# Patient Record
Sex: Male | Born: 2000 | Race: Black or African American | Hispanic: No | Marital: Single | Smoking: Current some day smoker
Health system: Southern US, Community
[De-identification: ages and names within clinical notes are randomized; demographics above are authoritative.]

## PROBLEM LIST (undated history)

## (undated) DIAGNOSIS — T7840XA Allergy, unspecified, initial encounter: Secondary | ICD-10-CM

## (undated) DIAGNOSIS — J4530 Mild persistent asthma, uncomplicated: Secondary | ICD-10-CM

## (undated) HISTORY — DX: Mild persistent asthma, uncomplicated: J45.30

## (undated) HISTORY — DX: Allergy, unspecified, initial encounter: T78.40XA

## (undated) HISTORY — PX: UMBILICAL HERNIA REPAIR: SUR1181

---
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
## 2019-05-12 ENCOUNTER — Encounter (HOSPITAL_COMMUNITY): Payer: Self-pay | Admitting: *Deleted

## 2019-05-12 ENCOUNTER — Emergency Department (HOSPITAL_COMMUNITY): Payer: Medicaid Other

## 2019-05-12 ENCOUNTER — Emergency Department (HOSPITAL_COMMUNITY)
Admission: EM | Admit: 2019-05-12 | Discharge: 2019-05-12 | Disposition: A | Discharge status: Home / Self Care | Payer: Medicaid Other | Attending: Emergency Medicine | Admitting: Emergency Medicine

## 2019-05-12 ENCOUNTER — Other Ambulatory Visit: Payer: Self-pay

## 2019-05-12 DIAGNOSIS — Y9301 Activity, walking, marching and hiking: Secondary | ICD-10-CM | POA: Insufficient documentation

## 2019-05-12 DIAGNOSIS — Y999 Unspecified external cause status: Secondary | ICD-10-CM | POA: Insufficient documentation

## 2019-05-12 DIAGNOSIS — J45909 Unspecified asthma, uncomplicated: Secondary | ICD-10-CM | POA: Diagnosis not present

## 2019-05-12 DIAGNOSIS — S93402A Sprain of unspecified ligament of left ankle, initial encounter: Secondary | ICD-10-CM | POA: Insufficient documentation

## 2019-05-12 DIAGNOSIS — Y9289 Other specified places as the place of occurrence of the external cause: Secondary | ICD-10-CM | POA: Insufficient documentation

## 2019-05-12 DIAGNOSIS — S99912A Unspecified injury of left ankle, initial encounter: Secondary | ICD-10-CM | POA: Diagnosis present

## 2019-05-12 DIAGNOSIS — X509XXA Other and unspecified overexertion or strenuous movements or postures, initial encounter: Secondary | ICD-10-CM | POA: Insufficient documentation

## 2019-05-12 MED ORDER — IBUPROFEN 400 MG PO TABS
400.0000 mg | ORAL_TABLET | Freq: Once | ORAL | Status: AC | PRN
  Administered 2019-05-12: 400 mg via ORAL
  Filled 2019-05-12: qty 1

## 2019-05-12 NOTE — Discharge Instructions (Addendum)
Wear Ace bandage for comfort and compression.  Ice for 20 minutes every 2 hours while awake for the next 2 days.  Keep your leg elevated as much as possible.  Follow-up with your primary doctor if not improving in the next 1 to 2 weeks.

## 2019-05-12 NOTE — ED Triage Notes (Signed)
Pt was walking to work, stumbled twice, injurying L ankle; unable to bear weight, mild swelling noted

## 2019-05-12 NOTE — ED Provider Notes (Signed)
[facility] Provider Note   CSN: [id number] Arrival date & time: 05/12/19  0006     History   Chief Complaint Chief Complaint  Patient presents with  . Foot Pain    HPI Donald Branch is a 18 y.o. male.     Patient is an 18 year old male presenting with complaints of a left ankle injury.  Patient states he was walking to work when he turned his ankle on 2 occasions.  He reports swelling and difficulty bearing weight.  The history is provided by the patient.  Foot Pain This is a new problem. Episode onset: Earlier this evening. The problem occurs constantly. The problem has not changed since onset.The symptoms are aggravated by walking. Nothing relieves the symptoms. He has tried nothing for the symptoms.    Past Medical History:  Diagnosis Date  . Allergy   . Asthma   . Asthma, mild persistent 12/13/2011    Patient Active Problem List   Diagnosis Date Noted  . Viral gastroenteritis 02/07/2014  . Right ankle pain 12/08/2012  . Asthma, mild persistent 12/13/2011    Past Surgical History:  Procedure Laterality Date  . UMBILICAL HERNIA REPAIR          Home Medications    Prior to Admission medications   Medication Sig Start Date End Date Taking? Authorizing Provider  albuterol (PROVENTIL,VENTOLIN) 90 MCG/ACT inhaler Inhale 2 puffs into the lungs every 6 (six) hours as needed for wheezing. 12/13/11 12/07/12  Durwin RegesKonkol, Jill N, MD  ibuprofen (ADVIL,MOTRIN) 200 MG tablet Take 200 mg by mouth every 6 (six) hours as needed for mild pain or moderate pain.    [provider]    Family History Family History  Problem Relation Age of Onset  . Thyroid disease Mother     Social History Social History   Tobacco Use  . Smoking status: Never Smoker  . Smokeless tobacco: Never Used  Substance Use Topics  . Alcohol use: No  . Drug use: No     Allergies   Patient has no known allergies.   Review of Systems Review of  Systems  All other systems reviewed and are negative.    Physical Exam Updated Vital Signs BP (!) 120/94 (BP Location: Right Arm)   Pulse 74   Temp 98.1 F (36.7 C) (Oral)   Resp 20   SpO2 100%   Physical Exam Vitals signs and nursing note reviewed.  Constitutional:      General: He is not in acute distress.    Appearance: Normal appearance. He is not ill-appearing.  HENT:     Head: Normocephalic and atraumatic.  Pulmonary:     Effort: Pulmonary effort is normal.  Musculoskeletal:     Comments: The left ankle appears grossly normal.  There is minimal swelling noted.  He is tender to the medial malleolus, however there is no deformity.  DP pulses are palpable.  Sensation is intact to the entire foot.  Skin:    General: Skin is warm and dry.  Neurological:     General: No focal deficit present.     Mental Status: He is alert.      ED Treatments / Results  Labs (all labs ordered are listed, but only abnormal results are displayed) Labs Reviewed - No data to display  EKG    Radiology Dg Ankle Complete Left  Result Date: 05/12/2019 CLINICAL DATA:  Left ankle injury, pain EXAM: LEFT ANKLE COMPLETE - 3+ VIEW COMPARISON:  None. FINDINGS: There is no evidence of fracture, dislocation, or joint effusion. There is no evidence of arthropathy or other focal bone abnormality. Soft tissues are unremarkable. IMPRESSION: Negative. Electronically Signed   By: Rolm Baptise M.D.   On: 05/12/2019 00:45    Procedures Procedures (including critical care time)  Medications Ordered in ED Medications  ibuprofen (ADVIL) tablet 400 mg (400 mg Oral Given 05/12/19 0023)     Initial Impression / Assessment and Plan / ED Course  I have reviewed the triage vital signs and the nursing notes.  Pertinent labs & imaging results that were available during my care of the patient were reviewed by me and considered in my medical decision making (see chart for details).  X-rays negative.  This  will be treated as a sprain.  Patient will be placed in an Ace bandage, advised to ice, rest, elevate, and follow-up as needed.  Final Clinical Impressions(s) / ED Diagnoses   Final diagnoses:  None    ED Discharge Orders    None       Veryl Speak, MD 05/12/19 [phone]

## 2019-09-24 IMAGING — CR LEFT ANKLE COMPLETE - 3+ VIEW
3 series · 3 of 3 positions shown · non-contrast
Comparison: None.

CLINICAL DATA: Left ankle injury, pain

EXAM:
LEFT ANKLE COMPLETE - 3+ VIEW

[ankle ap]
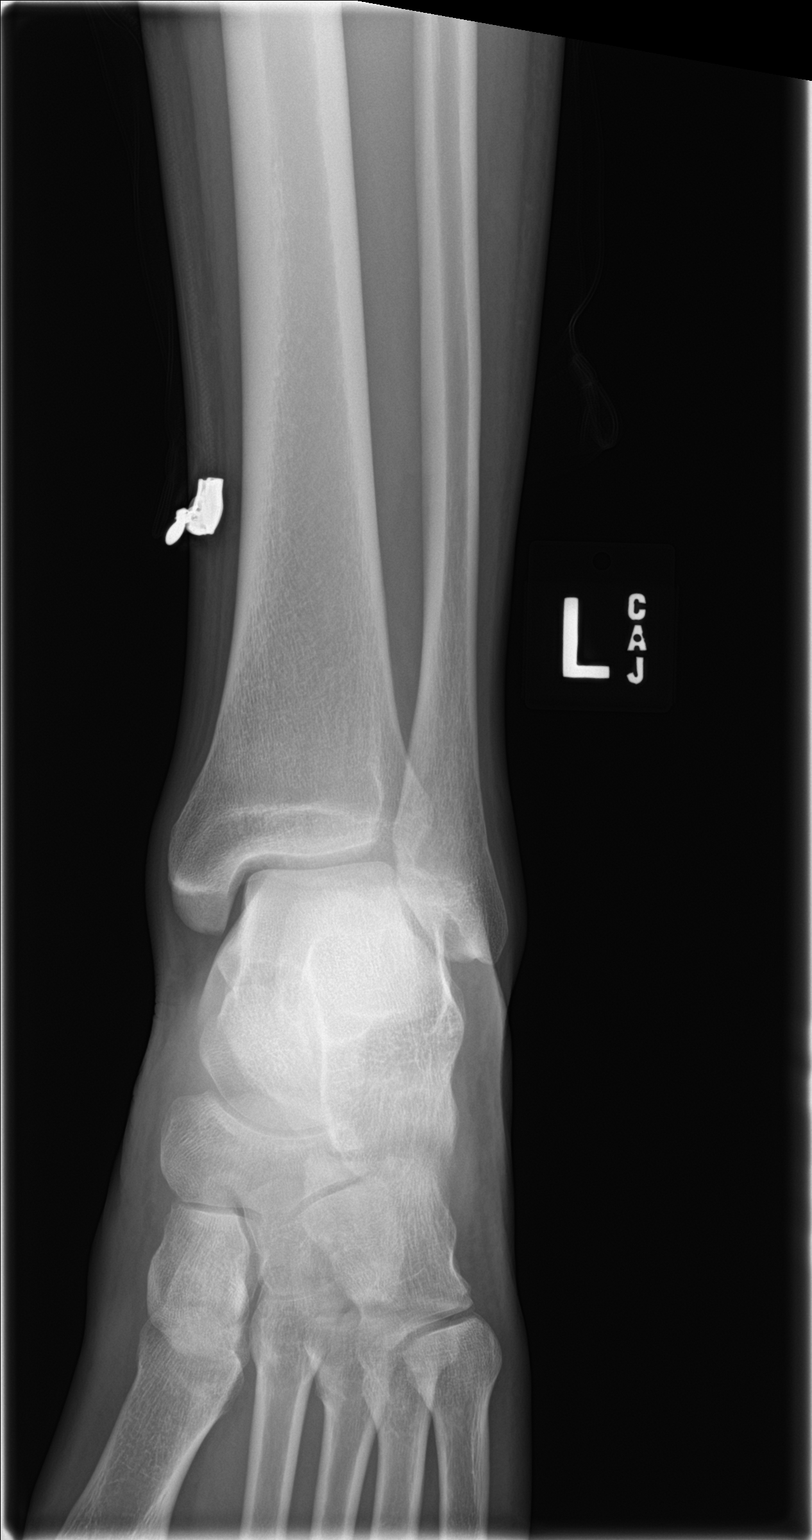

[ankle obl]
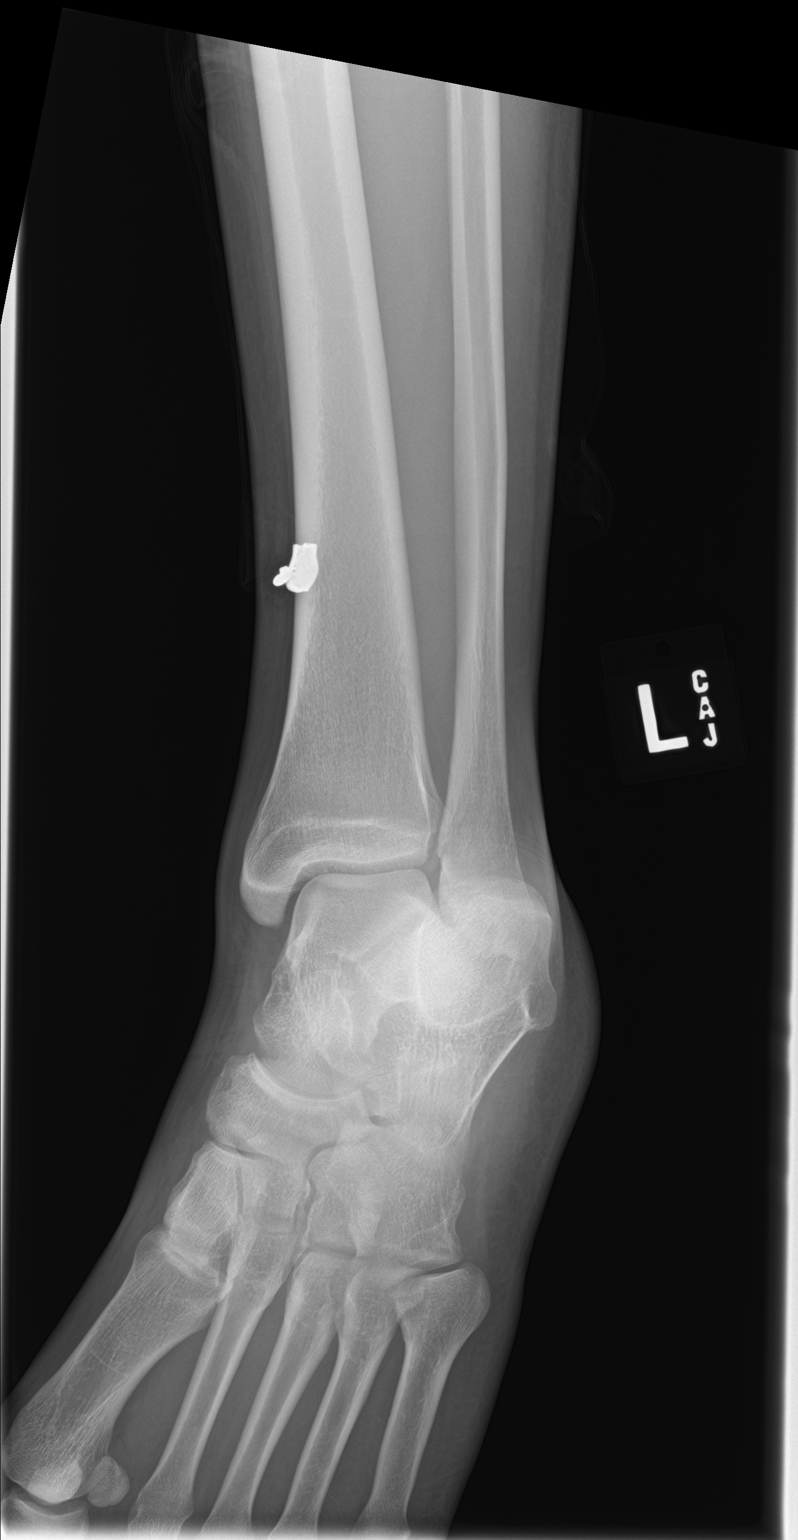

[ankle lat]
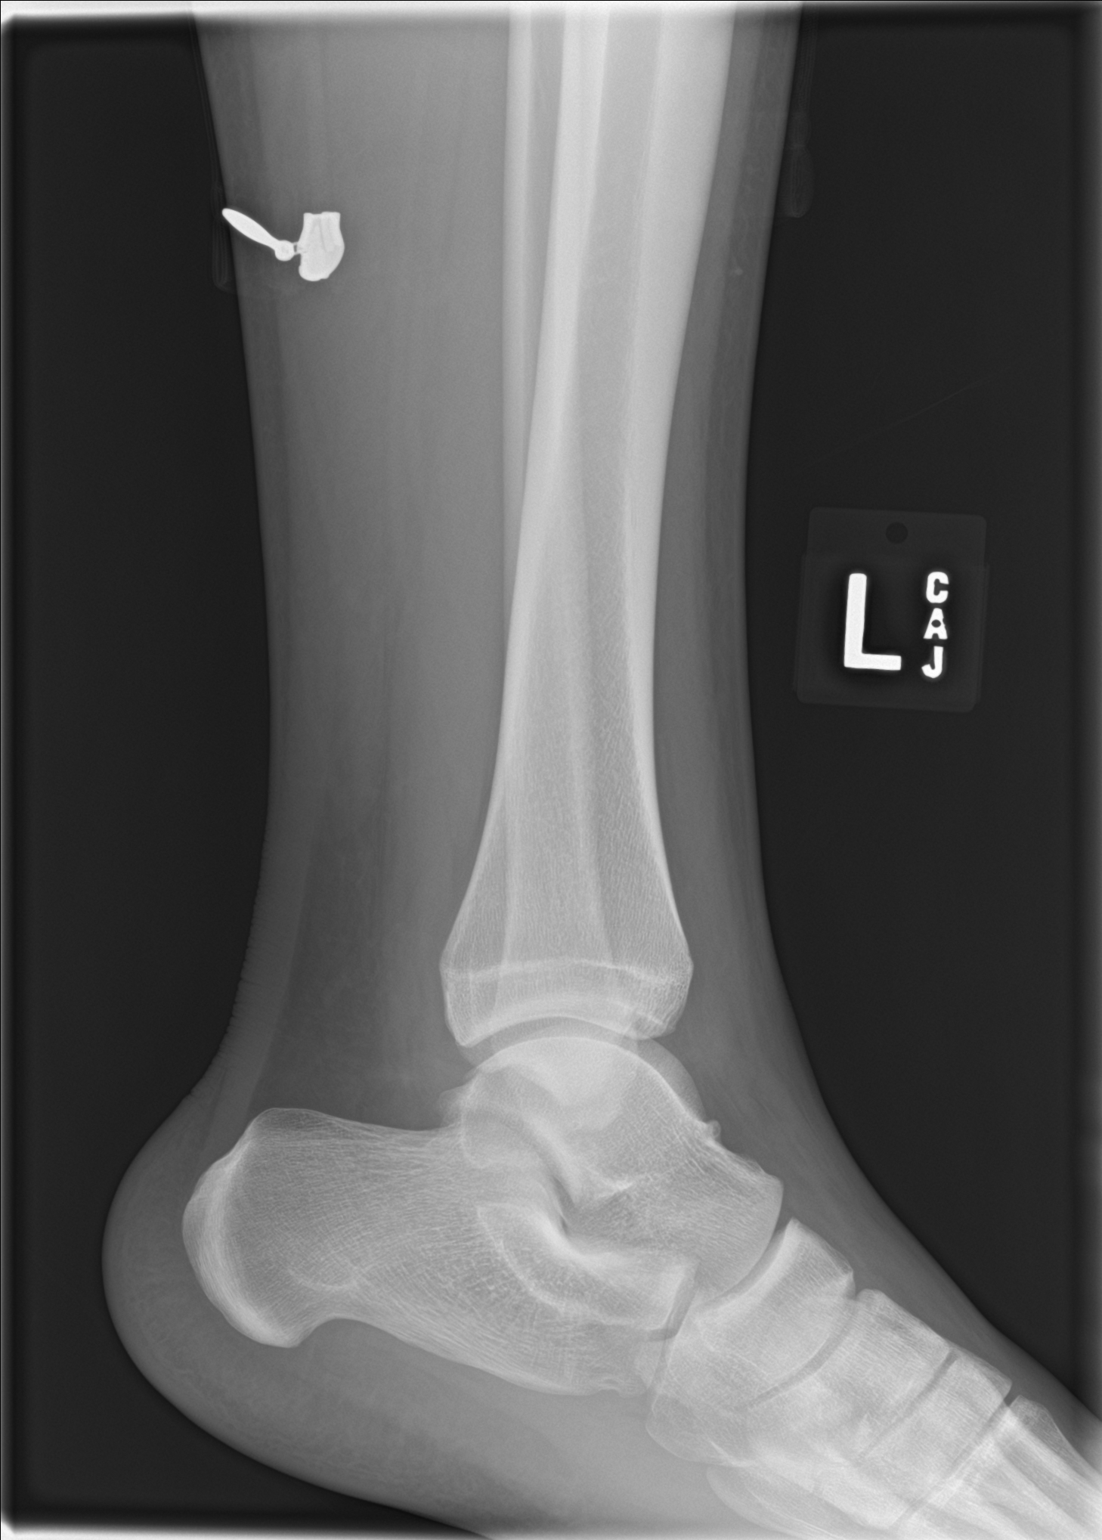

[3 of 3 positions shown; findings below may reference images not displayed]

FINDINGS: There is no evidence of fracture, dislocation, or joint effusion.
There is no evidence of arthropathy or other focal bone abnormality.
Soft tissues are unremarkable.
IMPRESSION: Negative.

## 2020-05-29 DIAGNOSIS — Z419 Encounter for procedure for purposes other than remedying health state, unspecified: Secondary | ICD-10-CM | POA: Diagnosis not present

## 2020-06-29 DIAGNOSIS — Z419 Encounter for procedure for purposes other than remedying health state, unspecified: Secondary | ICD-10-CM | POA: Diagnosis not present

## 2020-07-09 ENCOUNTER — Emergency Department (HOSPITAL_COMMUNITY)
Admission: EM | Admit: 2020-07-09 | Discharge: 2020-07-09 | Disposition: A | Discharge status: Home / Self Care | Payer: Medicaid Other | Attending: Emergency Medicine | Admitting: Emergency Medicine

## 2020-07-09 ENCOUNTER — Other Ambulatory Visit: Payer: Self-pay

## 2020-07-09 ENCOUNTER — Encounter (HOSPITAL_COMMUNITY): Payer: Self-pay | Admitting: Emergency Medicine

## 2020-07-09 DIAGNOSIS — J4530 Mild persistent asthma, uncomplicated: Secondary | ICD-10-CM | POA: Diagnosis not present

## 2020-07-09 DIAGNOSIS — Z1152 Encounter for screening for COVID-19: Secondary | ICD-10-CM | POA: Diagnosis not present

## 2020-07-09 DIAGNOSIS — U071 COVID-19: Secondary | ICD-10-CM | POA: Diagnosis not present

## 2020-07-09 DIAGNOSIS — Z20822 Contact with and (suspected) exposure to covid-19: Secondary | ICD-10-CM

## 2020-07-09 DIAGNOSIS — R430 Anosmia: Secondary | ICD-10-CM | POA: Diagnosis present

## 2020-07-09 LAB — RESPIRATORY PANEL BY RT PCR (FLU A&B, COVID)
Influenza A by PCR: NEGATIVE
Influenza B by PCR: NEGATIVE
SARS Coronavirus 2 by RT PCR: POSITIVE — AB

## 2020-07-09 NOTE — ED Triage Notes (Signed)
Patient states that he has had a covid exposure to family member.  Patient states that he has lost the sense of smell.  No shortness of breath, no fevers.

## 2020-07-09 NOTE — Discharge Instructions (Signed)
Your Covid results will take several hours to result.  You may check this via MyChart.  If this is positive, please be sure to quarantine for an additional 7 to 10 days.  You may take over-the-counter Tylenol/ ibuprofen, cold and flu medications, drink plenty of fluids.  You may buy over-the-counter pulse oximeter to measure your oxygen levels.  If this drops below 90%, please return to the ER.  Return to the ER for any worsening symptoms.     Person Under Monitoring Name: Donald Branch  Location: [address] [place] [address]   Infection Prevention Recommendations for Individuals Confirmed to have, or Being Evaluated for, 2019 Novel Coronavirus (COVID-19) Infection Who Receive Care at Home  Individuals who are confirmed to have, or are being evaluated for, COVID-19 should follow the prevention steps below until a healthcare provider or local or state health department says they can return to normal activities.  Stay home except to get medical care You should restrict activities outside your home, except for getting medical care. Do not go to work, school, or public areas, and do not use public transportation or taxis.  Call ahead before visiting your doctor Before your medical appointment, call the healthcare provider and tell them that you have, or are being evaluated for, COVID-19 infection. This will help the healthcare provider's office take steps to keep other people from getting infected. Ask your healthcare provider to call the local or state health department.  Monitor your symptoms Seek prompt medical attention if your illness is worsening (e.g., difficulty breathing). Before going to your medical appointment, call the healthcare provider and tell them that you have, or are being evaluated for, COVID-19 infection. Ask your healthcare provider to call the local or state health department.  Wear a facemask You should wear a facemask that covers your nose and mouth  when you are in the same room with other people and when you visit a healthcare provider. People who live with or visit you should also wear a facemask while they are in the same room with you.  Separate yourself from other people in your home As much as possible, you should stay in a different room from other people in your home. Also, you should use a separate bathroom, if available.  Avoid sharing household items You should not share dishes, drinking glasses, cups, eating utensils, towels, bedding, or other items with other people in your home. After using these items, you should wash them thoroughly with soap and water.  Cover your coughs and sneezes Cover your mouth and nose with a tissue when you cough or sneeze, or you can cough or sneeze into your sleeve. Throw used tissues in a lined trash can, and immediately wash your hands with soap and water for at least 20 seconds or use an alcohol-based hand rub.  Wash your [employer] your hands often and thoroughly with soap and water for at least 20 seconds. You can use an alcohol-based hand sanitizer if soap and water are not available and if your hands are not visibly dirty. Avoid touching your eyes, nose, and mouth with unwashed hands.   Prevention Steps for Caregivers and Household Members of Individuals Confirmed to have, or Being Evaluated for, COVID-19 Infection Being Cared for in the Home  If you live with, or provide care at home for, a person confirmed to have, or being evaluated for, COVID-19 infection please follow these guidelines to prevent infection:  Follow healthcare provider's instructions Make sure  that you understand and can help the patient follow any healthcare provider instructions for all care.  Provide for the patient's basic needs You should help the patient with basic needs in the home and provide support for getting groceries, prescriptions, and other personal needs.  Monitor the patient's symptoms If  they are getting sicker, call his or her medical provider and tell them that the patient has, or is being evaluated for, COVID-19 infection. This will help the healthcare provider's office take steps to keep other people from getting infected. Ask the healthcare provider to call the local or state health department.  Limit the number of people who have contact with the patient If possible, have only one caregiver for the patient. Other household members should stay in another home or place of residence. If this is not possible, they should stay in another room, or be separated from the patient as much as possible. Use a separate bathroom, if available. Restrict visitors who do not have an essential need to be in the home.  Keep older adults, very young children, and other sick people away from the patient Keep older adults, very young children, and those who have compromised immune systems or chronic health conditions away from the patient. This includes people with chronic heart, lung, or kidney conditions, diabetes, and cancer.  Ensure good ventilation Make sure that shared spaces in the home have good air flow, such as from an air conditioner or an opened window, weather permitting.  Wash your hands often Wash your hands often and thoroughly with soap and water for at least 20 seconds. You can use an alcohol based hand sanitizer if soap and water are not available and if your hands are not visibly dirty. Avoid touching your eyes, nose, and mouth with unwashed hands. Use disposable paper towels to dry your hands. If not available, use dedicated cloth towels and replace them when they become wet.  Wear a facemask and gloves Wear a disposable facemask at all times in the room and gloves when you touch or have contact with the patient's blood, body fluids, and/or secretions or excretions, such as sweat, saliva, sputum, nasal mucus, vomit, urine, or feces.  Ensure the mask fits over your nose  and mouth tightly, and do not touch it during use. Throw out disposable facemasks and gloves after using them. Do not reuse. Wash your hands immediately after removing your facemask and gloves. If your personal clothing becomes contaminated, carefully remove clothing and launder. Wash your hands after handling contaminated clothing. Place all used disposable facemasks, gloves, and other waste in a lined container before disposing them with other household waste. Remove gloves and wash your hands immediately after handling these items.  Do not share dishes, glasses, or other household items with the patient Avoid sharing household items. You should not share dishes, drinking glasses, cups, eating utensils, towels, bedding, or other items with a patient who is confirmed to have, or being evaluated for, COVID-19 infection. After the person uses these items, you should wash them thoroughly with soap and water.  Wash laundry thoroughly Immediately remove and wash clothes or bedding that have blood, body fluids, and/or secretions or excretions, such as sweat, saliva, sputum, nasal mucus, vomit, urine, or feces, on them. Wear gloves when handling laundry from the patient. Read and follow directions on labels of laundry or clothing items and detergent. In general, wash and dry with the warmest temperatures recommended on the label.  Clean all areas the individual has  used often Clean all touchable surfaces, such as counters, tabletops, doorknobs, bathroom fixtures, toilets, phones, keyboards, tablets, and bedside tables, every day. Also, clean any surfaces that may have blood, body fluids, and/or secretions or excretions on them. Wear gloves when cleaning surfaces the patient has come in contact with. Use a diluted bleach solution (e.g., dilute bleach with 1 part bleach and 10 parts water) or a household disinfectant with a label that says EPA-registered for coronaviruses. To make a bleach solution at  home, add 1 tablespoon of bleach to 1 quart (4 cups) of water. For a larger supply, add  cup of bleach to 1 gallon (16 cups) of water. Read labels of cleaning products and follow recommendations provided on product labels. Labels contain instructions for safe and effective use of the cleaning product including precautions you should take when applying the product, such as wearing gloves or eye protection and making sure you have good ventilation during use of the product. Remove gloves and wash hands immediately after cleaning.  Monitor yourself for signs and symptoms of illness Caregivers and household members are considered close contacts, should monitor their health, and will be asked to limit movement outside of the home to the extent possible. Follow the monitoring steps for close contacts listed on the symptom monitoring form.   ? If you have additional questions, contact your local health department or call the epidemiologist on call at [phone] (available 24/7). ? This guidance is subject to change. For the most up-to-date guidance from [facility], please refer to their website: [email or web address]

## 2020-07-09 NOTE — ED Provider Notes (Addendum)
[facility] EMERGENCY DEPARTMENT Provider Note   CSN: [id number] Arrival date & time: 07/09/20  2004     History Chief Complaint  Patient presents with  . Covid Exposure    Donald Branch is a 19 y.o. male.  HPI 19 year old male presents to the ER with complaints of loss of smell as of this morning.  Family member tested positive for Covid today.  No cough, shortness of breath, fevers or chills.  Not vaccinated.  No abdominal pain,  No nausea or vomiting.    Past Medical History:  Diagnosis Date  . Allergy   . Asthma   . Asthma, mild persistent 12/13/2011    Patient Active Problem List   Diagnosis Date Noted  . Viral gastroenteritis 02/07/2014  . Right ankle pain 12/08/2012  . Asthma, mild persistent 12/13/2011    Past Surgical History:  Procedure Laterality Date  . UMBILICAL HERNIA REPAIR         Family History  Problem Relation Age of Onset  . Thyroid disease Mother     Social History   Tobacco Use  . Smoking status: Never Smoker  . Smokeless tobacco: Never Used  Substance Use Topics  . Alcohol use: No  . Drug use: No    Home Medications Prior to Admission medications   Medication Sig Start Date End Date Taking? Authorizing Provider  albuterol (PROVENTIL,VENTOLIN) 90 MCG/ACT inhaler Inhale 2 puffs into the lungs every 6 (six) hours as needed for wheezing. 12/13/11 12/07/12  Durwin Reges, MD  ibuprofen (ADVIL,MOTRIN) 200 MG tablet Take 200 mg by mouth every 6 (six) hours as needed for mild pain or moderate pain.    [provider]    Allergies    Patient has no known allergies.  Review of Systems   Review of Systems  Constitutional: Negative for chills and fever.  HENT:       Loss of smell   Respiratory: Negative for cough and shortness of breath.   Cardiovascular: Negative for chest pain.    Physical Exam Updated Vital Signs BP (!) 143/82 (BP Location: Right Arm)   Pulse 63   Temp 98.5 F (36.9 C) (Oral)    Resp 16   SpO2 100%   Physical Exam Vitals and nursing note reviewed.  Constitutional:      General: He is not in acute distress.    Appearance: Normal appearance. He is well-developed. He is not ill-appearing or diaphoretic.  HENT:     Head: Normocephalic and atraumatic.     Mouth/Throat:     Mouth: Mucous membranes are moist.     Pharynx: Oropharynx is clear.  Eyes:     Conjunctiva/sclera: Conjunctivae normal.  Cardiovascular:     Rate and Rhythm: Normal rate and regular rhythm.     Pulses: Normal pulses.     Heart sounds: Normal heart sounds. No murmur heard.   Pulmonary:     Effort: Pulmonary effort is normal. No respiratory distress.     Breath sounds: Normal breath sounds.  Abdominal:     General: Abdomen is flat.     Palpations: Abdomen is soft.     Tenderness: There is no abdominal tenderness.  Musculoskeletal:        General: Normal range of motion.     Cervical back: Normal range of motion and neck supple.  Skin:    General: Skin is warm and dry.  Neurological:     General: No focal deficit present.  Mental Status: He is alert and oriented to person, place, and time.     ED Results / Procedures / Treatments   Labs (all labs ordered are listed, but only abnormal results are displayed) Labs Reviewed - No data to display  EKG None  Radiology No results found.  Procedures Procedures (including critical care time)  Medications Ordered in ED Medications - No data to display  ED Course  I have reviewed the triage vital signs and the nursing notes.  Pertinent labs & imaging results that were available during my care of the patient were reviewed by me and considered in my medical decision making (see chart for details).    MDM Rules/Calculators/A&P                           Patient evaluated, tested and sent home with instructions for home care and Quarantine.  No evidence of respiratory distress, vitals reassuring.  Instructed to seek further care  if symptoms worsen.  Instructed on supportive care.  Final Clinical Impression(s) / ED Diagnoses Final diagnoses:  Encounter for laboratory testing for COVID-19 virus    Rx / DC Orders ED Discharge Orders    None          Leone Brand 07/09/20 2029    Charlynne Pander, MD 07/09/20 2242

## 2020-07-30 DIAGNOSIS — Z419 Encounter for procedure for purposes other than remedying health state, unspecified: Secondary | ICD-10-CM | POA: Diagnosis not present

## 2020-08-29 DIAGNOSIS — Z419 Encounter for procedure for purposes other than remedying health state, unspecified: Secondary | ICD-10-CM | POA: Diagnosis not present

## 2020-09-29 DIAGNOSIS — Z419 Encounter for procedure for purposes other than remedying health state, unspecified: Secondary | ICD-10-CM | POA: Diagnosis not present

## 2020-10-29 DIAGNOSIS — Z419 Encounter for procedure for purposes other than remedying health state, unspecified: Secondary | ICD-10-CM | POA: Diagnosis not present

## 2020-11-29 DIAGNOSIS — Z419 Encounter for procedure for purposes other than remedying health state, unspecified: Secondary | ICD-10-CM | POA: Diagnosis not present

## 2020-12-09 ENCOUNTER — Other Ambulatory Visit: Payer: Self-pay | Admitting: Nurse Practitioner

## 2020-12-09 ENCOUNTER — Telehealth: Payer: Self-pay | Admitting: Nurse Practitioner

## 2020-12-09 NOTE — Telephone Encounter (Signed)
Patient states that he did not need Covid treatment. Thanks.

## 2020-12-09 NOTE — Telephone Encounter (Signed)
Symptoms onset 12/06/20. + home test 12/08/20. Shun, [place] [phone]. Please review & refer if appropriate to covid treatment center.

## 2020-12-10 DIAGNOSIS — Z1152 Encounter for screening for COVID-19: Secondary | ICD-10-CM | POA: Diagnosis not present

## 2020-12-13 DIAGNOSIS — Z20828 Contact with and (suspected) exposure to other viral communicable diseases: Secondary | ICD-10-CM | POA: Diagnosis not present

## 2020-12-14 ENCOUNTER — Other Ambulatory Visit: Payer: Self-pay

## 2020-12-14 ENCOUNTER — Encounter (HOSPITAL_COMMUNITY): Payer: Self-pay

## 2020-12-14 ENCOUNTER — Emergency Department (HOSPITAL_COMMUNITY)
Admission: EM | Admit: 2020-12-14 | Discharge: 2020-12-14 | Disposition: A | Discharge status: Home / Self Care | Payer: Medicaid Other | Attending: Emergency Medicine | Admitting: Emergency Medicine

## 2020-12-14 DIAGNOSIS — R300 Dysuria: Secondary | ICD-10-CM | POA: Diagnosis not present

## 2020-12-14 DIAGNOSIS — Z202 Contact with and (suspected) exposure to infections with a predominantly sexual mode of transmission: Secondary | ICD-10-CM

## 2020-12-14 DIAGNOSIS — J45909 Unspecified asthma, uncomplicated: Secondary | ICD-10-CM | POA: Diagnosis not present

## 2020-12-14 LAB — URINALYSIS, ROUTINE W REFLEX MICROSCOPIC
Bilirubin Urine: NEGATIVE
Glucose, UA: NEGATIVE mg/dL
Hgb urine dipstick: NEGATIVE
Ketones, ur: NEGATIVE mg/dL
Leukocytes,Ua: NEGATIVE
Nitrite: NEGATIVE
Protein, ur: NEGATIVE mg/dL
Specific Gravity, Urine: 1.021 (ref 1.005–1.030)
pH: 7 (ref 5.0–8.0)

## 2020-12-14 MED ORDER — DOXYCYCLINE HYCLATE 100 MG PO CAPS: 100.0000 mg | ORAL_CAPSULE | Freq: Two times a day (BID) | ORAL | 0 refills | Status: DC

## 2020-12-14 MED ORDER — LIDOCAINE HCL (PF) 1 % IJ SOLN: 1.0000 mL | Freq: Once | INTRAMUSCULAR | Status: AC

## 2020-12-14 MED ORDER — CEFTRIAXONE SODIUM 500 MG IJ SOLR
500.0000 mg | Freq: Once | INTRAMUSCULAR | Status: AC
  Administered 2020-12-14: 500 mg via INTRAMUSCULAR
  Filled 2020-12-14: qty 500

## 2020-12-14 MED ORDER — LIDOCAINE HCL (PF) 1 % IJ SOLN
INTRAMUSCULAR | Status: AC
  Filled 2020-12-14: qty 5

## 2020-12-14 NOTE — ED Triage Notes (Signed)
Pt reports he had unprotected sex last night and woke up this morning with burning urination.

## 2020-12-14 NOTE — Discharge Instructions (Addendum)
You were tested for gonorrhea and chlamydia. You have already been treated today for gonorrhea. If your test results are positive for chlamydia you should fill and begin taking the prescription for doxycycline. You will be called in the next 2 to 3 days if any results are positive, you can also review negative results online.  Please make sure you are using protection when sexually active to help prevent further STDs. You should not be sexually active until you have completed all antibiotics and your symptoms have resolved. You can go to the health department for any future STD testing needs.

## 2020-12-14 NOTE — ED Provider Notes (Signed)
[facility] EMERGENCY DEPARTMENT Provider Note   CSN: [id number] Arrival date & time: 12/14/20  9326     History Chief Complaint  Patient presents with  . Exposure to STD    Donald Branch is a 20 y.o. male.  Donald Branch is a 20 y.o. male with a history of allergy and asthma, who presents to the ED for evaluation of dysuria and concern for STD. Patient states that he went to a party last night and had unprotected sex with a new partner. He woke up this morning and had burning with urination. He also noticed some penile discharge. He has not noticed any bumps or lesions. No inguinal pain or swelling. No testicular pain or swelling. No abdominal pain. No rectal pain or pain with defecation. No rashes. No other aggravating or alleviating factors.        Past Medical History:  Diagnosis Date  . Allergy   . Asthma   . Asthma, mild persistent 12/13/2011    Patient Active Problem List   Diagnosis Date Noted  . Viral gastroenteritis 02/07/2014  . Right ankle pain 12/08/2012  . Asthma, mild persistent 12/13/2011    Past Surgical History:  Procedure Laterality Date  . UMBILICAL HERNIA REPAIR         Family History  Problem Relation Age of Onset  . Thyroid disease Mother     Social History   Tobacco Use  . Smoking status: Never Smoker  . Smokeless tobacco: Never Used  Substance Use Topics  . Alcohol use: No  . Drug use: No    Home Medications Prior to Admission medications   Medication Sig Start Date End Date Taking? Authorizing Provider  doxycycline (VIBRAMYCIN) 100 MG capsule Take 1 capsule (100 mg total) by mouth 2 (two) times daily. Fill and take prescription only if you test positive for chlamydia. 12/14/20  Yes Dartha Lodge, PA-C  albuterol (PROVENTIL,VENTOLIN) 90 MCG/ACT inhaler Inhale 2 puffs into the lungs every 6 (six) hours as needed for wheezing. 12/13/11 12/07/12  Durwin Reges, MD  ibuprofen (ADVIL,MOTRIN) 200 MG tablet Take 200 mg  by mouth every 6 (six) hours as needed for mild pain or moderate pain.    [provider]    Allergies    Patient has no known allergies.  Review of Systems   Review of Systems  Constitutional: Negative for chills and fever.  Gastrointestinal: Negative for abdominal pain, nausea and vomiting.  Genitourinary: Positive for dysuria and penile discharge. Negative for flank pain, frequency, hematuria, penile swelling, scrotal swelling and testicular pain.  Skin: Negative for color change and rash.  All other systems reviewed and are negative.   Physical Exam Updated Vital Signs BP 130/80 (BP Location: Right Arm)   Pulse 81   Temp 98.3 F (36.8 C) (Oral)   Resp 18   SpO2 98%   Physical Exam Vitals and nursing note reviewed. Exam conducted with a chaperone present.  Constitutional:      General: He is not in acute distress.    Appearance: Normal appearance. He is well-developed, normal weight and well-nourished. He is not ill-appearing or diaphoretic.  HENT:     Head: Normocephalic and atraumatic.  Eyes:     General:        Right eye: No discharge.        Left eye: No discharge.  Pulmonary:     Effort: Pulmonary effort is normal. No respiratory distress.  Abdominal:     General: There  is no distension.     Palpations: Abdomen is soft.     Tenderness: There is no abdominal tenderness.  Genitourinary:    Comments: Chaperone present during genital exam. No external genital lesions noted, no inguinal lymphadenopathy Uncircumcised penis with no visible discharge present, no erythema or swelling. No testicular tenderness or masses, no tenderness over the spermatic cord. Skin:    General: Skin is warm and dry.  Neurological:     Mental Status: He is alert and oriented to person, place, and time.     Coordination: Coordination normal.  Psychiatric:        Mood and Affect: Mood and affect and mood normal.        Behavior: Behavior normal.     ED Results / Procedures  / Treatments   Labs (all labs ordered are listed, but only abnormal results are displayed) Labs Reviewed  URINALYSIS, ROUTINE W REFLEX MICROSCOPIC  GC/CHLAMYDIA PROBE AMP ([place]) NOT AT [facility]    EKG None  Radiology No results found.  Procedures Procedures (including critical care time)  Medications Ordered in ED Medications  cefTRIAXone (ROCEPHIN) injection 500 mg (has no administration in time range)  lidocaine (PF) (XYLOCAINE) 1 % injection 1 mL (has no administration in time range)  lidocaine (PF) (XYLOCAINE) 1 % injection (has no administration in time range)    ED Course  I have reviewed the triage vital signs and the nursing notes.  Pertinent labs & imaging results that were available during my care of the patient were reviewed by me and considered in my medical decision making (see chart for details).    MDM Rules/Calculators/A&P                         Patient is afebrile without abdominal tenderness, abdominal pain or painful bowel movements to indicate prostatitis.  No tenderness to palpation of the testes or epididymis to suggest orchitis or epididymitis.  STD cultures obtained including gonorrhea and chlamydia. Patient to be discharged with instructions to follow up with PCP/heath department. Discussed importance of using protection when sexually active. Pt understands that they have GC/Chlamydia cultures pending and that they will need to inform all sexual partners if results return positive. Patient has been treated prophylactically with Rocephin and given prescription for doxycycline to be used if he test positive for chlamydia.    Final Clinical Impression(s) / ED Diagnoses Final diagnoses:  Possible exposure to STD  Dysuria    Rx / DC Orders ED Discharge Orders         Ordered    doxycycline (VIBRAMYCIN) 100 MG capsule  2 times daily        12/14/20 0828           Dartha Lodge, PA-C 12/14/20 4967    Gwyneth Sprout, MD 12/18/20  1524

## 2020-12-15 LAB — GC/CHLAMYDIA PROBE AMP (~~LOC~~) NOT AT ARMC
Chlamydia: POSITIVE — AB
Comment: NEGATIVE
Comment: NORMAL
Neisseria Gonorrhea: POSITIVE — AB

## 2020-12-19 ENCOUNTER — Other Ambulatory Visit: Payer: Self-pay

## 2020-12-19 DIAGNOSIS — J4530 Mild persistent asthma, uncomplicated: Secondary | ICD-10-CM

## 2020-12-30 DIAGNOSIS — Z419 Encounter for procedure for purposes other than remedying health state, unspecified: Secondary | ICD-10-CM | POA: Diagnosis not present

## 2021-01-27 DIAGNOSIS — Z419 Encounter for procedure for purposes other than remedying health state, unspecified: Secondary | ICD-10-CM | POA: Diagnosis not present

## 2021-02-27 DIAGNOSIS — Z419 Encounter for procedure for purposes other than remedying health state, unspecified: Secondary | ICD-10-CM | POA: Diagnosis not present

## 2021-03-29 DIAGNOSIS — Z419 Encounter for procedure for purposes other than remedying health state, unspecified: Secondary | ICD-10-CM | POA: Diagnosis not present

## 2021-04-29 DIAGNOSIS — Z419 Encounter for procedure for purposes other than remedying health state, unspecified: Secondary | ICD-10-CM | POA: Diagnosis not present

## 2021-05-29 DIAGNOSIS — Z419 Encounter for procedure for purposes other than remedying health state, unspecified: Secondary | ICD-10-CM | POA: Diagnosis not present

## 2021-06-29 DIAGNOSIS — Z419 Encounter for procedure for purposes other than remedying health state, unspecified: Secondary | ICD-10-CM | POA: Diagnosis not present

## 2021-07-30 DIAGNOSIS — Z419 Encounter for procedure for purposes other than remedying health state, unspecified: Secondary | ICD-10-CM | POA: Diagnosis not present

## 2021-07-31 ENCOUNTER — Ambulatory Visit
Admission: EM | Admit: 2021-07-31 | Discharge: 2021-07-31 | Disposition: A | Discharge status: Home / Self Care | Payer: Medicaid Other | Attending: Urgent Care | Admitting: Urgent Care

## 2021-07-31 ENCOUNTER — Other Ambulatory Visit: Payer: Self-pay

## 2021-07-31 DIAGNOSIS — R350 Frequency of micturition: Secondary | ICD-10-CM | POA: Diagnosis not present

## 2021-07-31 DIAGNOSIS — A749 Chlamydial infection, unspecified: Secondary | ICD-10-CM | POA: Insufficient documentation

## 2021-07-31 DIAGNOSIS — J4530 Mild persistent asthma, uncomplicated: Secondary | ICD-10-CM | POA: Insufficient documentation

## 2021-07-31 MED ORDER — DOXYCYCLINE HYCLATE 100 MG PO CAPS: 100.0000 mg | ORAL_CAPSULE | Freq: Two times a day (BID) | ORAL | 0 refills | Status: AC

## 2021-07-31 MED ORDER — ALBUTEROL SULFATE HFA 108 (90 BASE) MCG/ACT IN AERS: 1.0000 | INHALATION_SPRAY | Freq: Four times a day (QID) | RESPIRATORY_TRACT | 0 refills | Status: AC | PRN

## 2021-07-31 NOTE — ED Provider Notes (Signed)
[facility]   MRN: [id number] DOB: [birth year]  Subjective:   Donald Branch is a 20 y.o. male presenting for exposure to chlamydia. Patient has unprotected sex with 2 male partners. One of them tested positive for chlamydia. Has had urinary frequency. Has a history of gonorrhea, chlamydia. Denies dysuria, hematuria, penile discharge, penile swelling, testicular pain, testicular swelling, anal pain, groin pain.  No current facility-administered medications for this encounter.  Current Outpatient Medications:    albuterol (PROVENTIL,VENTOLIN) 90 MCG/ACT inhaler, Inhale 2 puffs into the lungs every 6 (six) hours as needed for wheezing., Disp: 17 g, Rfl: 1   doxycycline (VIBRAMYCIN) 100 MG capsule, Take 1 capsule (100 mg total) by mouth 2 (two) times daily. Fill and take prescription only if you test positive for chlamydia., Disp: 14 capsule, Rfl: 0   ibuprofen (ADVIL,MOTRIN) 200 MG tablet, Take 200 mg by mouth every 6 (six) hours as needed for mild pain or moderate pain., Disp: , Rfl:    No Known Allergies  Past Medical History:  Diagnosis Date   Allergy    Asthma    Asthma, mild persistent 12/13/2011     Past Surgical History:  Procedure Laterality Date   UMBILICAL HERNIA REPAIR      Family History  Problem Relation Age of Onset   Thyroid disease Mother     Social History   Tobacco Use   Smoking status: Never   Smokeless tobacco: Never  Substance Use Topics   Alcohol use: No   Drug use: No    ROS   Objective:   Vitals: BP 138/80 (BP Location: Left Arm)   Pulse 72   Temp 97.7 F (36.5 C) (Oral)   Resp 18   SpO2 97%   Physical Exam Constitutional:      General: He is not in acute distress.    Appearance: Normal appearance. He is well-developed and normal weight. He is not ill-appearing, toxic-appearing or diaphoretic.  HENT:     Head: Normocephalic and atraumatic.     Right Ear: External ear normal.     Left Ear: External ear normal.      Nose: Nose normal.     Mouth/Throat:     Pharynx: Oropharynx is clear.  Eyes:     General: No scleral icterus.       Right eye: No discharge.        Left eye: No discharge.     Extraocular Movements: Extraocular movements intact.     Pupils: Pupils are equal, round, and reactive to light.  Cardiovascular:     Rate and Rhythm: Normal rate.  Pulmonary:     Effort: Pulmonary effort is normal.  Musculoskeletal:     Cervical back: Normal range of motion.  Neurological:     Mental Status: He is alert and oriented to person, place, and time.  Psychiatric:        Mood and Affect: Mood normal.        Behavior: Behavior normal.        Thought Content: Thought content normal.        Judgment: Judgment normal.    Assessment and Plan :   PDMP not reviewed this encounter.  1. Chlamydia infection   2. Urinary frequency     Patient treated empirically as per CDC guidelines with doxycycline as an outpatient given exposure.  Labs pending.  Counseled on safe sex practices including abstaining for 1 week following treatment.  Counseled patient on potential for adverse effects with medications  prescribed/recommended today, ER and return-to-clinic precautions discussed, patient verbalized understanding.   Wallis Bamberg, PA-C 07/31/21 1352

## 2021-07-31 NOTE — Discharge Instructions (Addendum)
Avoid all forms of sexual intercourse (oral, vaginal, anal) for the next 7 days to avoid spreading/reinfecting or at least until we can see what kinds of infection results are positive. Return if symptoms worsen/do not resolve, you develop fever, abdominal pain, blood in your urine, or are re-exposed to a sexually transmitted infection (STI).  

## 2021-07-31 NOTE — ED Triage Notes (Signed)
Pt reports one of his partners told him yesterday that she tested positive for chlamydia. Denies dysuria, hematuria and discharge.  Confirms urinary frequency.Denies urinary urgency. Pt requests albuterol refill, noting that he has been exercising more lately and needing to use his inhaler more.

## 2021-08-01 LAB — HIV ANTIBODY (ROUTINE TESTING W REFLEX): HIV Screen 4th Generation wRfx: NONREACTIVE

## 2021-08-01 LAB — RPR: RPR Ser Ql: NONREACTIVE

## 2021-08-04 LAB — CYTOLOGY, (ORAL, ANAL, URETHRAL) ANCILLARY ONLY
Chlamydia: POSITIVE — AB
Comment: NEGATIVE
Comment: NEGATIVE
Comment: NORMAL
Neisseria Gonorrhea: NEGATIVE
Trichomonas: NEGATIVE

## 2021-08-29 DIAGNOSIS — Z419 Encounter for procedure for purposes other than remedying health state, unspecified: Secondary | ICD-10-CM | POA: Diagnosis not present

## 2021-09-29 DIAGNOSIS — Z419 Encounter for procedure for purposes other than remedying health state, unspecified: Secondary | ICD-10-CM | POA: Diagnosis not present

## 2021-10-29 DIAGNOSIS — Z419 Encounter for procedure for purposes other than remedying health state, unspecified: Secondary | ICD-10-CM | POA: Diagnosis not present

## 2021-11-29 DIAGNOSIS — Z419 Encounter for procedure for purposes other than remedying health state, unspecified: Secondary | ICD-10-CM | POA: Diagnosis not present

## 2021-12-30 DIAGNOSIS — Z419 Encounter for procedure for purposes other than remedying health state, unspecified: Secondary | ICD-10-CM | POA: Diagnosis not present

## 2022-01-27 DIAGNOSIS — Z419 Encounter for procedure for purposes other than remedying health state, unspecified: Secondary | ICD-10-CM | POA: Diagnosis not present

## 2022-02-15 DIAGNOSIS — H5213 Myopia, bilateral: Secondary | ICD-10-CM | POA: Diagnosis not present

## 2022-02-27 DIAGNOSIS — Z419 Encounter for procedure for purposes other than remedying health state, unspecified: Secondary | ICD-10-CM | POA: Diagnosis not present

## 2022-03-29 DIAGNOSIS — Z419 Encounter for procedure for purposes other than remedying health state, unspecified: Secondary | ICD-10-CM | POA: Diagnosis not present

## 2022-04-29 DIAGNOSIS — Z419 Encounter for procedure for purposes other than remedying health state, unspecified: Secondary | ICD-10-CM | POA: Diagnosis not present

## 2022-05-29 DIAGNOSIS — Z419 Encounter for procedure for purposes other than remedying health state, unspecified: Secondary | ICD-10-CM | POA: Diagnosis not present

## 2022-06-29 DIAGNOSIS — Z419 Encounter for procedure for purposes other than remedying health state, unspecified: Secondary | ICD-10-CM | POA: Diagnosis not present

## 2022-07-05 DIAGNOSIS — R300 Dysuria: Secondary | ICD-10-CM | POA: Diagnosis not present

## 2022-07-05 DIAGNOSIS — Z113 Encounter for screening for infections with a predominantly sexual mode of transmission: Secondary | ICD-10-CM | POA: Diagnosis not present

## 2022-07-05 DIAGNOSIS — Z114 Encounter for screening for human immunodeficiency virus [HIV]: Secondary | ICD-10-CM | POA: Diagnosis not present

## 2022-07-05 DIAGNOSIS — Z202 Contact with and (suspected) exposure to infections with a predominantly sexual mode of transmission: Secondary | ICD-10-CM | POA: Diagnosis not present

## 2022-10-29 DIAGNOSIS — Z419 Encounter for procedure for purposes other than remedying health state, unspecified: Secondary | ICD-10-CM | POA: Diagnosis not present

## 2023-12-19 ENCOUNTER — Ambulatory Visit: Payer: Medicaid Other | Attending: Obstetrics and Gynecology

## 2023-12-19 DIAGNOSIS — Z31440 Encounter of male for testing for genetic disease carrier status for procreative management: Secondary | ICD-10-CM

## 2024-01-01 LAB — HORIZON CUSTOM: REPORT SUMMARY: NEGATIVE

## 2024-06-15 ENCOUNTER — Encounter: Payer: Self-pay | Admitting: Advanced Practice Midwife
